# Patient Record
Sex: Female | Born: 1960 | Race: Black or African American | Hispanic: No | Marital: Single | Smoking: Current every day smoker
Health system: Southern US, Community
[De-identification: ages and names within clinical notes are randomized; demographics above are authoritative.]

## PROBLEM LIST (undated history)

## (undated) HISTORY — PX: GASTRIC BYPASS: SHX52

## (undated) HISTORY — PX: KNEE SURGERY: SHX244

## (undated) HISTORY — PX: TUBAL LIGATION: SHX77

---

## 2017-11-01 ENCOUNTER — Encounter (HOSPITAL_COMMUNITY): Payer: Self-pay | Admitting: Emergency Medicine

## 2017-11-01 ENCOUNTER — Ambulatory Visit (HOSPITAL_COMMUNITY)
Admission: EM | Admit: 2017-11-01 | Discharge: 2017-11-01 | Disposition: A | Discharge status: Home / Self Care | Payer: Self-pay | Attending: Family Medicine | Admitting: Family Medicine

## 2017-11-01 DIAGNOSIS — E860 Dehydration: Secondary | ICD-10-CM | POA: Insufficient documentation

## 2017-11-01 DIAGNOSIS — E785 Hyperlipidemia, unspecified: Secondary | ICD-10-CM | POA: Insufficient documentation

## 2017-11-01 DIAGNOSIS — F17210 Nicotine dependence, cigarettes, uncomplicated: Secondary | ICD-10-CM | POA: Insufficient documentation

## 2017-11-01 DIAGNOSIS — R102 Pelvic and perineal pain: Secondary | ICD-10-CM

## 2017-11-01 DIAGNOSIS — I10 Essential (primary) hypertension: Secondary | ICD-10-CM | POA: Insufficient documentation

## 2017-11-01 LAB — POCT URINALYSIS DIP (DEVICE)
Bilirubin Urine: NEGATIVE
Glucose, UA: NEGATIVE mg/dL
Hgb urine dipstick: NEGATIVE
Ketones, ur: NEGATIVE mg/dL
NITRITE: NEGATIVE
Protein, ur: NEGATIVE mg/dL
Specific Gravity, Urine: 1.025 (ref 1.005–1.030)
Urobilinogen, UA: 0.2 mg/dL (ref 0.0–1.0)
pH: 6 (ref 5.0–8.0)

## 2017-11-01 MED ORDER — KETOROLAC TROMETHAMINE 60 MG/2ML IM SOLN
INTRAMUSCULAR | Status: AC
Start: 2017-11-01 — End: ?
  Filled 2017-11-01: qty 2

## 2017-11-01 MED ORDER — KETOROLAC TROMETHAMINE 60 MG/2ML IM SOLN
60.0000 mg | Freq: Once | INTRAMUSCULAR | Status: AC
  Administered 2017-11-01: 60 mg via INTRAMUSCULAR

## 2017-11-01 NOTE — ED Provider Notes (Signed)
  MRN: [id number] DOB: [birth year]  Subjective:   Megan Horne is a 57 y.o. female presenting for acute onset today of intermittent pelvic cramping. Feels like a pressure type sensation that comes in waves. Has also had urinary frequency. Has tried heating pad. Has not tried any medications for relief. Takes vitamins. Denies fever, n/v, dysuria, hematuria, constipation, bloody stools. Patient does not have a PCP, [place] Last pap smear was ~2 years ago, has had normal pap smears. Smokes 5 cigarettes per day. She is trying to quit.   No Known Allergies  Has a history of HTN, HL, meniscus tear of left knee, arthritis.   Past Surgical History:  Procedure Laterality Date  . GASTRIC BYPASS    . KNEE SURGERY    . TUBAL LIGATION      Objective:   Vitals: BP 125/71   Pulse (!) 55   Temp 98.1 F (36.7 C)   Resp 16   SpO2 100%   Physical Exam  Constitutional: She is oriented to person, place, and time. She appears well-developed and well-nourished.  HENT:  Mouth/Throat: Oropharynx is clear and moist.  Eyes: No scleral icterus.  Cardiovascular: Normal rate, regular rhythm and intact distal pulses. Exam reveals no gallop and no friction rub.  No murmur heard. Pulmonary/Chest: No respiratory distress. She has no wheezes. She has no rales.  Abdominal: Soft. Bowel sounds are normal. She exhibits no distension and no mass. There is no tenderness. There is no rebound, no guarding and no CVA tenderness.  Musculoskeletal: She exhibits no edema.  Neurological: She is alert and oriented to person, place, and time.  Skin: Skin is warm and dry. No rash noted. No erythema. No pallor.  Psychiatric: She has a normal mood and affect.   Results for orders placed or performed during the hospital encounter of 11/01/17 (from the past 24 hour(s))  POCT urinalysis dip (device)     Status: Abnormal   Collection Time: 11/01/17 11:39 AM  Result Value Ref Range   Glucose, UA NEGATIVE  NEGATIVE mg/dL   Bilirubin Urine NEGATIVE NEGATIVE   Ketones, ur NEGATIVE NEGATIVE mg/dL   Specific Gravity, Urine 1.025 1.005 - 1.030   Hgb urine dipstick NEGATIVE NEGATIVE   pH 6.0 5.0 - 8.0   Protein, ur NEGATIVE NEGATIVE mg/dL   Urobilinogen, UA 0.2 0.0 - 1.0 mg/dL   Nitrite NEGATIVE NEGATIVE   Leukocytes, UA SMALL (A) NEGATIVE    Assessment and Plan :   Pelvic cramping  Dehydration  Recommended patient hydrate much more, states that she just drinks coffee. IM Toradol in clinic today. Return-to-clinic precautions discussed, patient verbalized understanding.    Wallis BambergMani, Jalani Rominger, PA-C 11/01/17 1147

## 2017-11-01 NOTE — Discharge Instructions (Addendum)
Hydrate well with at least 2 liters (1 gallon) of water daily.  °

## 2017-11-01 NOTE — ED Triage Notes (Signed)
Pt c/o abdominal cramping, states it feels like period cramps but hasn't had a cycle in 5 years. Noticed it this morning. Denies n/v/d. No issues urinating. No vaginal discharge.

## 2017-11-02 LAB — URINE CULTURE

## 2017-12-13 ENCOUNTER — Emergency Department (HOSPITAL_COMMUNITY): Payer: Self-pay

## 2017-12-13 ENCOUNTER — Encounter (HOSPITAL_COMMUNITY): Payer: Self-pay | Admitting: *Deleted

## 2017-12-13 ENCOUNTER — Emergency Department (HOSPITAL_COMMUNITY)
Admission: EM | Admit: 2017-12-13 | Discharge: 2017-12-13 | Disposition: A | Discharge status: Home / Self Care | Payer: Self-pay | Attending: Emergency Medicine | Admitting: Emergency Medicine

## 2017-12-13 ENCOUNTER — Other Ambulatory Visit: Payer: Self-pay

## 2017-12-13 DIAGNOSIS — K5790 Diverticulosis of intestine, part unspecified, without perforation or abscess without bleeding: Secondary | ICD-10-CM | POA: Insufficient documentation

## 2017-12-13 DIAGNOSIS — R1030 Lower abdominal pain, unspecified: Secondary | ICD-10-CM

## 2017-12-13 DIAGNOSIS — Z79899 Other long term (current) drug therapy: Secondary | ICD-10-CM | POA: Insufficient documentation

## 2017-12-13 DIAGNOSIS — F17210 Nicotine dependence, cigarettes, uncomplicated: Secondary | ICD-10-CM | POA: Insufficient documentation

## 2017-12-13 LAB — COMPREHENSIVE METABOLIC PANEL
ALT: 16 U/L (ref 0–44)
AST: 22 U/L (ref 15–41)
Albumin: 4.1 g/dL (ref 3.5–5.0)
Alkaline Phosphatase: 61 U/L (ref 38–126)
Anion gap: 10 (ref 5–15)
BILIRUBIN TOTAL: 0.9 mg/dL (ref 0.3–1.2)
BUN: 9 mg/dL (ref 6–20)
CALCIUM: 8.9 mg/dL (ref 8.9–10.3)
CO2: 25 mmol/L (ref 22–32)
CREATININE: 0.83 mg/dL (ref 0.44–1.00)
Chloride: 107 mmol/L (ref 98–111)
GFR calc non Af Amer: >60 mL/min (ref >60)
GLUCOSE: 95 mg/dL (ref 70–99)
Potassium: 3.7 mmol/L (ref 3.5–5.1)
Sodium: 142 mmol/L (ref 135–145)
TOTAL PROTEIN: 7.2 g/dL (ref 6.5–8.1)

## 2017-12-13 LAB — URINALYSIS, ROUTINE W REFLEX MICROSCOPIC
BILIRUBIN URINE: NEGATIVE
Glucose, UA: NEGATIVE mg/dL
HGB URINE DIPSTICK: NEGATIVE
KETONES UR: 5 mg/dL — AB
Leukocytes, UA: NEGATIVE
Nitrite: NEGATIVE
PROTEIN: NEGATIVE mg/dL
Specific Gravity, Urine: 1.028 (ref 1.005–1.030)
pH: 5 (ref 5.0–8.0)

## 2017-12-13 LAB — CBC
HCT: 39.4 % (ref 36.0–46.0)
Hemoglobin: 12.5 g/dL (ref 12.0–15.0)
MCH: 29.3 pg (ref 26.0–34.0)
MCHC: 31.7 g/dL (ref 30.0–36.0)
MCV: 92.3 fL (ref 78.0–100.0)
PLATELETS: 233 10*3/uL (ref 150–400)
RBC: 4.27 MIL/uL (ref 3.87–5.11)
RDW: 13.2 % (ref 11.5–15.5)
WBC: 4.5 10*3/uL (ref 4.0–10.5)

## 2017-12-13 LAB — LIPASE, BLOOD: Lipase: 30 U/L (ref 11–51)

## 2017-12-13 LAB — I-STAT BETA HCG BLOOD, ED (MC, WL, AP ONLY)

## 2017-12-13 MED ORDER — KETOROLAC TROMETHAMINE 30 MG/ML IJ SOLN
30.0000 mg | Freq: Once | INTRAMUSCULAR | Status: AC
  Administered 2017-12-13: 30 mg via INTRAVENOUS
  Filled 2017-12-13: qty 1

## 2017-12-13 MED ORDER — IOPAMIDOL (ISOVUE-370) INJECTION 76%
100.0000 mL | Freq: Once | INTRAVENOUS | Status: AC | PRN
  Administered 2017-12-13: 100 mL via INTRAVENOUS

## 2017-12-13 MED ORDER — SODIUM CHLORIDE 0.9 % IV BOLUS
1000.0000 mL | Freq: Once | INTRAVENOUS | Status: AC
  Administered 2017-12-13: 1000 mL via INTRAVENOUS

## 2017-12-13 NOTE — ED Notes (Signed)
Pt given urine cup to provide urine sample out in the waiting room. 

## 2017-12-13 NOTE — ED Provider Notes (Signed)
[facility] EMERGENCY DEPARTMENT Provider Note   CSN: [id number] Arrival date & time: 12/13/17  1002     History   Chief Complaint Chief Complaint  Patient presents with  . Abdominal Pain    HPI Megan Horne is a 57 y.o. otherwise healthy female, with a PSHx of gastric bypass and tubal ligation, who presents to the ED with complaints of lower abdominal pain that began today.  She states that she has had similar pain several weeks ago, was seen at urgent care on 11/01/2017 and her UA was unremarkable, she was given a 60 mg IM Toradol shot and discharged home.  She states that she was fine after that however this morning her pain returned and feels the same as when she was seen at the urgent care several weeks ago.  She describes the pain as 6/10 constant crampy/contraction-like lower abdominal pain which is nonradiating with no known aggravating factors, and no treatments tried prior to arrival.  She endorses drinking alcohol occasionally, has about 1-2 wine coolers approximately 4 times per week.  She states that she is very active, walking and running a lot.  She does not currently have a PCP since [place] back [place].  She denies fevers, chills, CP, SOB, nausea/vomiting, diarrhea/constipation, obstipation, melena, hematochezia, hematuria, dysuria, vaginal bleeding/discharge, myalgias, arthralgias, numbness, tingling, focal weakness, or any other complaints at this time. Denies recent travel, sick contacts, suspicious food intake, or frequent NSAID use.   The history is provided by the patient and medical records. No language interpreter was used.    History reviewed. No pertinent past medical history.  There are no active problems to display for this patient.   Past Surgical History:  Procedure Laterality Date  . GASTRIC BYPASS    . KNEE SURGERY    . TUBAL LIGATION       OB History   None      Home Medications    Prior to  Admission medications   Medication Sig Start Date End Date Taking? Authorizing Provider  Multiple Vitamin (MULTIVITAMIN WITH MINERALS) TABS tablet Take 1 tablet by mouth daily.   Yes [provider]  vitamin B-12 (CYANOCOBALAMIN) 1000 MCG tablet Take 1,000 mcg by mouth 3 (three) times a week.   Yes [provider]    Family History No family history on file.  Social History Social History   Tobacco Use  . Smoking status: Current Every Day Smoker    Packs/day: 0.25    Types: Cigarettes  . Smokeless tobacco: Never Used  Substance Use Topics  . Alcohol use: Never    Frequency: Never  . Drug use: Not on file     Allergies   Amoxicillin   Review of Systems Review of Systems  Constitutional: Negative for chills and fever.  Respiratory: Negative for shortness of breath.   Cardiovascular: Negative for chest pain.  Gastrointestinal: Positive for abdominal pain. Negative for blood in stool, constipation, diarrhea, nausea and vomiting.  Genitourinary: Negative for dysuria, hematuria, vaginal bleeding and vaginal discharge.  Musculoskeletal: Negative for arthralgias and myalgias.  Skin: Negative for color change.  Allergic/Immunologic: Negative for immunocompromised state.  Neurological: Negative for weakness and numbness.  Psychiatric/Behavioral: Negative for confusion.   All other systems reviewed and are negative for acute change except as noted in the HPI.    Physical Exam Updated Vital Signs BP 119/61 (BP Location: Right Arm)   Pulse (!) 52   Temp 98.4 F (  36.9 C) (Oral)   Resp 16   SpO2 100%   Physical Exam  Constitutional: She is oriented to person, place, and time. Vital signs are normal. She appears well-developed and well-nourished.  Non-toxic appearance. No distress.  Afebrile, nontoxic, NAD  HENT:  Head: Normocephalic and atraumatic.  Mouth/Throat: Oropharynx is clear and moist and mucous membranes are normal.  Eyes: Conjunctivae and EOM are  normal. Right eye exhibits no discharge. Left eye exhibits no discharge.  Neck: Normal range of motion. Neck supple.  Cardiovascular: Regular rhythm, normal heart sounds and intact distal pulses. Bradycardia present. Exam reveals no gallop and no friction rub.  No murmur heard. Mildly bradycardic similar to prior visit  Pulmonary/Chest: Effort normal and breath sounds normal. No respiratory distress. She has no decreased breath sounds. She has no wheezes. She has no rhonchi. She has no rales.  Abdominal: Soft. Normal appearance and bowel sounds are normal. She exhibits no distension. There is tenderness in the right lower quadrant, suprapubic area and left lower quadrant. There is no rigidity, no rebound, no guarding, no CVA tenderness, no tenderness at McBurney's point and negative Murphy's sign.  Soft, nondistended, +BS throughout, with moderate lower abd TTP across entire lower abd, no r/g/r, neg murphy's, neg mcburney's, no CVA TTP   Musculoskeletal: Normal range of motion.  Neurological: She is alert and oriented to person, place, and time. She has normal strength. No sensory deficit.  Skin: Skin is warm, dry and intact. No rash noted.  Psychiatric: She has a normal mood and affect.  Nursing note and vitals reviewed.    ED Treatments / Results  Labs (all labs ordered are listed, but only abnormal results are displayed) Labs Reviewed  URINALYSIS, ROUTINE W REFLEX MICROSCOPIC - Abnormal; Notable for the following components:      Result Value   Color, Urine AMBER (*)    Ketones, ur 5 (*)    All other components within normal limits  LIPASE, BLOOD  COMPREHENSIVE METABOLIC PANEL  CBC  I-STAT BETA HCG BLOOD, ED ([facility], [facility], [facility] ONLY)    EKG None  Radiology Ct Abdomen Pelvis W Contrast  Result Date: 12/13/2017 CLINICAL DATA:  Lower abdominal pain. EXAM: CT ABDOMEN AND PELVIS WITH CONTRAST TECHNIQUE: Multidetector CT imaging of the abdomen and pelvis was performed using the standard  protocol following bolus administration of intravenous contrast. CONTRAST:  ISOVUE-370 IOPAMIDOL (ISOVUE-370) INJECTION 76% COMPARISON:  None. FINDINGS: Lower chest: Normal. Hepatobiliary: 9 mm cyst in the left lobe of the liver. Liver parenchyma is otherwise normal. Biliary tree is normal. Pancreas: Unremarkable. No pancreatic ductal dilatation or surrounding inflammatory changes. Spleen: Normal in size without focal abnormality. Adrenals/Urinary Tract: Adrenal glands are normal. 7 mm cyst in the upper pole of the right kidney. 3 mm cyst in the mid right kidney. Left pelvic kidney. Bladder appears normal. Stomach/Bowel: Surgical staples from previous gastric bypass. There are a few diverticula in the distal colon. Terminal ileum and appendix are normal. Vascular/Lymphatic: Minimal aortic atherosclerosis.  No adenopathy. Reproductive: Small benign-appearing calcifications in the uterus. Ovaries appear normal. Other: No abdominal wall hernia or abnormality. No abdominopelvic ascites. Musculoskeletal: No acute abnormalities. Fairly severe facet arthritis at L4-5 and L5-S1. IMPRESSION: No acute abnormalities of the abdomen or pelvis. Slight diverticulosis of the distal colon. Left pelvic kidney. Electronically Signed   By: Francene Boyers M.D.   On: 12/13/2017 14:25    Procedures Procedures (including critical care time)  Medications Ordered in ED Medications  sodium chloride 0.9 %  bolus 1,000 mL (0 mLs Intravenous Stopped 12/13/17 1420)  ketorolac (TORADOL) 30 MG/ML injection 30 mg (30 mg Intravenous Given 12/13/17 1304)  iopamidol (ISOVUE-370) 76 % injection 100 mL (100 mLs Intravenous Contrast Given 12/13/17 1402)     Initial Impression / Assessment and Plan / ED Course  I have reviewed the triage vital signs and the nursing notes.  Pertinent labs & imaging results that were available during my care of the patient were reviewed by me and considered in my medical decision making (see chart for  details).     57 y.o. female here with lower abd pain since this morning, similar symptoms a few weeks ago. On exam, moderate lower abd TTP, nonperitoneal. Work up thus far reveals: betaHCG neg, lipase WNL, CMP/CBC WNL. U/A pending. Will proceed with CT imaging to r/o emergent causes and further evaluate this recurrent pain. Will give toradol and fluids and reassess shortly.   3:40 PM U/A unremarkable without evidence of UTI. CT showing 9mm cyst in L lobe of liver, 7mm cyst in upper pole of R kidney and 3mm mid right kidney cyst, with incidental finding of L pelvic kidney; also with mild diverticulosis but no diverticulitis. Otherwise no acute findings. Unclear exact etiology of her pain, however doesn't appear to have any acute findings on work up today. Advised increased fiber/water intake in diet, tylenol for pain (since pt can't take NSAIDs due to gastric bypass surgery), and f/up with [facility] in 1wk for recheck and to establish medical care. I explained the diagnosis and have given explicit precautions to return to the ER including for any other new or worsening symptoms. The patient understands and accepts the medical plan as it's been dictated and I have answered their questions. Discharge instructions concerning home care and prescriptions have been given. The patient is STABLE and is discharged to home in good condition.    Final Clinical Impressions(s) / ED Diagnoses   Final diagnoses:  Lower abdominal pain  Diverticulosis    ED Discharge Orders    [address]None       Cayleigh Paull, [place], [place]-C 12/13/17 1540    Terrilee FilesButler, Michael C, MD 12/14/17 [phone]

## 2017-12-13 NOTE — ED Notes (Signed)
Pt states that she has R hip pain for years, has recently gotten worse

## 2017-12-13 NOTE — ED Notes (Signed)
Pt returned from ct

## 2017-12-13 NOTE — Discharge Instructions (Addendum)
Your work up today has been very reassuring, your CT scan showed some diverticulosis which is unlikely the cause of your pain but you will need to increase your fiber and water intake in your diet. Use tylenol as directed as needed for pain. Stay well hydrated. If you get constipated, then use over the counter miralax as needed. Follow up with the [facility] in 1 week for recheck of symptoms and ongoing management of your recurrent abdominal pain. Return to the ER for emergent changes or worsening symptoms.   Abdominal (belly) pain can be caused by many things. Your caregiver performed an examination and possibly ordered blood/urine tests and imaging (CT scan, x-rays, ultrasound). Many cases can be observed and treated at home after initial evaluation in the emergency department. Even though you are being discharged home, abdominal pain can be unpredictable. Therefore, you need a repeated exam if your pain does not resolve, returns, or worsens. Most patients with abdominal pain don't have to be admitted to the hospital or have surgery, but serious problems like appendicitis and gallbladder attacks can start out as nonspecific pain. Many abdominal conditions cannot be diagnosed in one visit, so follow-up evaluations are very important. SEEK IMMEDIATE MEDICAL ATTENTION IF YOU DEVELOP ANY OF THE FOLLOWING SYMPTOMS: The pain does not go away or becomes severe.  A temperature above 101 develops.  Repeated vomiting occurs (multiple episodes).  The pain becomes localized to portions of the abdomen. The right side could possibly be appendicitis. In an adult, the left lower portion of the abdomen could be colitis or diverticulitis.  Blood is being passed in stools or vomit (bright red or black tarry stools).  Return also if you develop chest pain, difficulty breathing, dizziness or fainting, or become confused, poorly responsive, or inconsolable (young children). The constipation stays for more  than 4 days.  There is belly (abdominal) or rectal pain.  You do not seem to be getting better.

## 2017-12-13 NOTE — ED Triage Notes (Signed)
Pt c/o bil  Lower abd pain with hx of the same, pt denies n/v/d, pt A&O x4

## 2018-06-16 ENCOUNTER — Emergency Department (HOSPITAL_COMMUNITY)
Admission: EM | Admit: 2018-06-16 | Discharge: 2018-06-16 | Disposition: A | Discharge status: Home / Self Care | Payer: Self-pay | Attending: Emergency Medicine | Admitting: Emergency Medicine

## 2018-06-16 ENCOUNTER — Emergency Department (HOSPITAL_COMMUNITY): Payer: Self-pay

## 2018-06-16 DIAGNOSIS — Y92241 Library as the place of occurrence of the external cause: Secondary | ICD-10-CM | POA: Insufficient documentation

## 2018-06-16 DIAGNOSIS — Y999 Unspecified external cause status: Secondary | ICD-10-CM | POA: Insufficient documentation

## 2018-06-16 DIAGNOSIS — Y939 Activity, unspecified: Secondary | ICD-10-CM | POA: Insufficient documentation

## 2018-06-16 DIAGNOSIS — W230XXA Caught, crushed, jammed, or pinched between moving objects, initial encounter: Secondary | ICD-10-CM | POA: Insufficient documentation

## 2018-06-16 DIAGNOSIS — F17210 Nicotine dependence, cigarettes, uncomplicated: Secondary | ICD-10-CM | POA: Insufficient documentation

## 2018-06-16 DIAGNOSIS — Z79899 Other long term (current) drug therapy: Secondary | ICD-10-CM | POA: Insufficient documentation

## 2018-06-16 DIAGNOSIS — S92424B Nondisplaced fracture of distal phalanx of right great toe, initial encounter for open fracture: Secondary | ICD-10-CM

## 2018-06-16 DIAGNOSIS — S92424A Nondisplaced fracture of distal phalanx of right great toe, initial encounter for closed fracture: Secondary | ICD-10-CM | POA: Insufficient documentation

## 2018-06-16 MED ORDER — HYDROCODONE-ACETAMINOPHEN 5-325 MG PO TABS
1.0000 | ORAL_TABLET | Freq: Once | ORAL | Status: AC
Start: 2018-06-16 — End: 2018-06-16
  Administered 2018-06-16: 1 via ORAL
  Filled 2018-06-16: qty 1

## 2018-06-16 MED ORDER — CEPHALEXIN 250 MG PO CAPS: 250.0000 mg | ORAL_CAPSULE | Freq: Four times a day (QID) | ORAL | 0 refills | Status: AC

## 2018-06-16 MED ORDER — HYDROCODONE-ACETAMINOPHEN 5-325 MG PO TABS: 1.0000 | ORAL_TABLET | Freq: Four times a day (QID) | ORAL | 0 refills | Status: AC | PRN

## 2018-06-16 NOTE — ED Provider Notes (Signed)
[facility] EMERGENCY DEPARTMENT Provider Note   CSN: [id number] Arrival date & time: 06/16/18  1237     History   Chief Complaint Chief Complaint  Patient presents with  . Foot Injury    HPI Megan Horne is a 58 y.o. female presenting for evaluation of foot injury.  Patient states just prior to arrival she was at [employer] when a wooden table fell on her toe.  She reports acute onset right great toe pain and second toe pain.  She reports she is ambulatory with a limp.  She denies numbness or tingling.  She is not on blood thinners.  She has not taken anything for pain including Tylenol or ibuprofen.  Patient states she takes vitamins daily due to her gastric bypass surgery 7 years ago, no other medical problems or medications.  Pain is constant, worse with palpation.  Nothing makes it better.  She has a superficial laceration of her right great toe without active bleeding. She denies injury elsewhere.  HPI  No past medical history on file.  There are no active problems to display for this patient.   Past Surgical History:  Procedure Laterality Date  . GASTRIC BYPASS    . KNEE SURGERY    . TUBAL LIGATION       OB History   No obstetric history on file.      Home Medications    Prior to Admission medications   Medication Sig Start Date End Date Taking? Authorizing Provider  cephALEXin (KEFLEX) 250 MG capsule Take 1 capsule (250 mg total) by mouth 4 (four) times daily for 5 days. 06/16/18 06/21/18  Elizabelle Fite, PA-C  HYDROcodone-acetaminophen (NORCO/VICODIN) 5-325 MG tablet Take 1 tablet by mouth every 6 (six) hours as needed for severe pain. 06/16/18   Jentri Aye, PA-C  Multiple Vitamin (MULTIVITAMIN WITH MINERALS) TABS tablet Take 1 tablet by mouth daily.    [provider]  vitamin B-12 (CYANOCOBALAMIN) 1000 MCG tablet Take 1,000 mcg by mouth 3 (three) times a week.    [provider]    Family History No family  history on file.  Social History Social History   Tobacco Use  . Smoking status: Current Every Day Smoker    Packs/day: 0.25    Types: Cigarettes  . Smokeless tobacco: Never Used  Substance Use Topics  . Alcohol use: Never    Frequency: Never  . Drug use: Not on file     Allergies   Amoxicillin   Review of Systems Review of Systems  Musculoskeletal: Positive for arthralgias.  Hematological: Does not bruise/bleed easily.     Physical Exam Updated Vital Signs BP (!) 158/91 (BP Location: Left Arm)   Pulse (!) 52   Temp 98.2 F (36.8 C) (Oral)   Resp 16   Ht 5\' 5"  (1.651 m)   Wt 75.8 kg   SpO2 99%   BMI 27.79 kg/m   Physical Exam Vitals signs and nursing note reviewed.  Constitutional:      General: She is not in acute distress.    Appearance: She is well-developed.  HENT:     Head: Normocephalic and atraumatic.  Neck:     Musculoskeletal: Normal range of motion.  Pulmonary:     Effort: Pulmonary effort is normal.  Abdominal:     General: There is no distension.  Musculoskeletal:        General: Swelling and tenderness present.     Comments: Swelling of the right great toe  and second toe.  Tenderness palpation of the great toe and second toe.  No tenderness palpation of the foot or ankle.  Pedal pulses intact.  Good cap refill.  Sensation of distal toe intact. Superficial laceration just proximal to the cuticle without active bleeding. No associated subungal hematoma.   Skin:    General: Skin is warm.     Capillary Refill: Capillary refill takes less than 2 seconds.     Findings: No rash.  Neurological:     Mental Status: She is alert and oriented to person, place, and time.      ED Treatments / Results  Labs (all labs ordered are listed, but only abnormal results are displayed) Labs Reviewed - No data to display  EKG None  Radiology Dg Foot Complete Right  Result Date: 06/16/2018 CLINICAL DATA:  58 year old female with right great toe injury.  A wooden table fell on her toe. EXAM: RIGHT FOOT COMPLETE - 3+ VIEW COMPARISON:  None. FINDINGS: Nondisplaced, mildly comminuted fracture through the tuft of the distal phalanx of the great toe. There is associated soft tissue swelling. No additional fracture or malalignment identified. Mild degenerative change at the great toe MTP joint. IMPRESSION: Mildly comminuted but nondisplaced fracture through the distal tuft of the great toe distal phalanx. Electronically Signed   By: Malachy Moan M.D.   On: 06/16/2018 14:49    Procedures Procedures (including critical care time)  Medications Ordered in ED Medications  HYDROcodone-acetaminophen (NORCO/VICODIN) 5-325 MG per tablet 1 tablet (1 tablet Oral Given 06/16/18 1430)     Initial Impression / Assessment and Plan / ED Course  I have reviewed the triage vital signs and the nursing notes.  Pertinent labs & imaging results that were available during my care of the patient were reviewed by me and considered in my medical decision making (see chart for details).     Pt presenting for evaluation of toe pain after a wooden table fell on her toe.  Physical exam shows swelling and tenderness, patient is neurovascularly intact.  Superficial laceration does not require suturing.  Will obtain x-ray for further evaluation.  Norco given for pain control.  X-rays viewed interpreted by me, shows comminuted and nondisplaced fracture of the distal great toe without fracture elsewhere.  As there is overlying laceration, will treat with antibiotics.  Patient given postop shoe for pain control.  PMP checked, patient without recent controlled substance prescription.  Discussed follow-up with podiatry for further evaluation management.  At this time, patient appears safe for discharge.  Return precautions given.  Patient states she understands agrees plan.   Final Clinical Impressions(s) / ED Diagnoses   Final diagnoses:  Open nondisplaced fracture of distal  phalanx of right great toe, initial encounter    ED Discharge Orders         Ordered    HYDROcodone-acetaminophen (NORCO/VICODIN) 5-325 MG tablet  Every 6 hours PRN     06/16/18 1502    cephALEXin (KEFLEX) 250 MG capsule  4 times daily     06/16/18 1502           Jovoni Borkenhagen, PA-C 06/16/18 1540    Jacalyn Lefevre, MD 06/16/18 1629

## 2018-06-16 NOTE — Discharge Instructions (Signed)
Take antibiotics as prescribed. Use Tylenol as needed for mild to moderate pain.  Use Norco as needed for severe breakthrough pain. Have caution, this is a narcotic medicine.  Not drive or operate heavy machinery while taking this medicine. Keep your foot elevated to help with pain and swelling. Use ice to help with pain and swelling. Follow-up with podiatry for further evaluation. Return to the emergency room if you develop fevers, pus draining from the area, or any new, worsening, concerning symptoms.

## 2018-06-16 NOTE — ED Triage Notes (Signed)
Onset PTA wood table fell on left toe, bleeding noted around cuticle.

## 2019-04-15 IMAGING — CT CT ABD-PELV W/ CM
2 of 6 series · 17 of 46 positions shown, 19 images · IV contrast (iopamidol)
Comparison: None.

CLINICAL DATA: Lower abdominal pain.

EXAM:
CT ABDOMEN AND PELVIS WITH CONTRAST
TECHNIQUE: Multidetector CT imaging of the abdomen and pelvis was performed
using the standard protocol following bolus administration of
intravenous contrast.
CONTRAST:  100mL NOLCB8-Y2R IOPAMIDOL (NOLCB8-Y2R) INJECTION 76%

[Series 3: abd/ pelvis 5.0 i30f 2 · axial · 0.78mm/px · z∈[+721,+1101]mm · 14 of 88 slices shown, 16 images]
[im 6/88  soft-tissue]
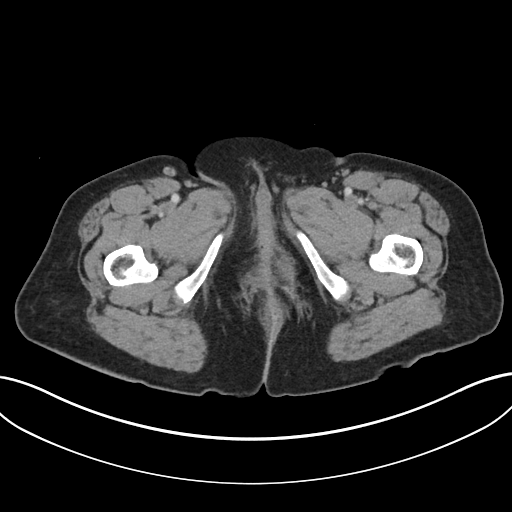
[im 6/88  bone]
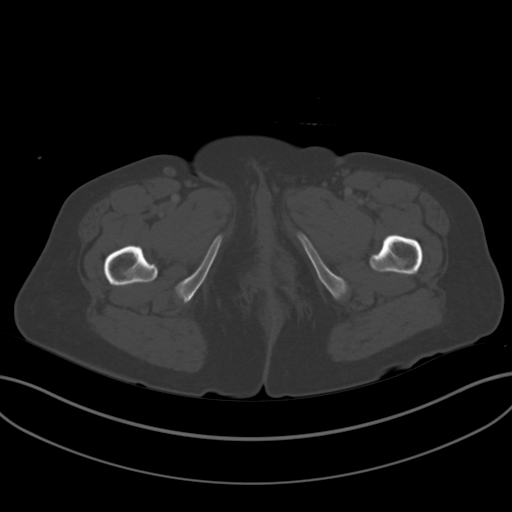
[im 11/88  soft-tissue]
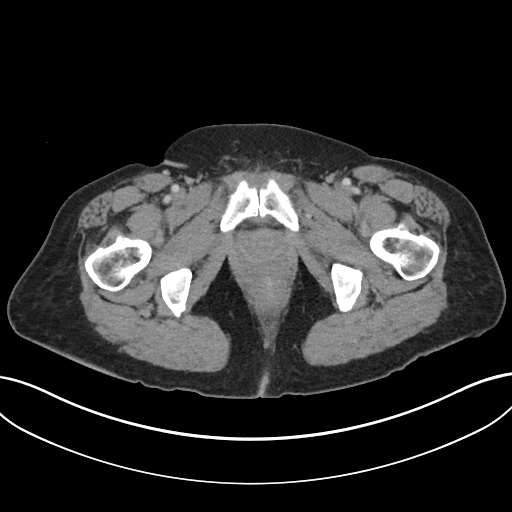
[im 17/88  soft-tissue]
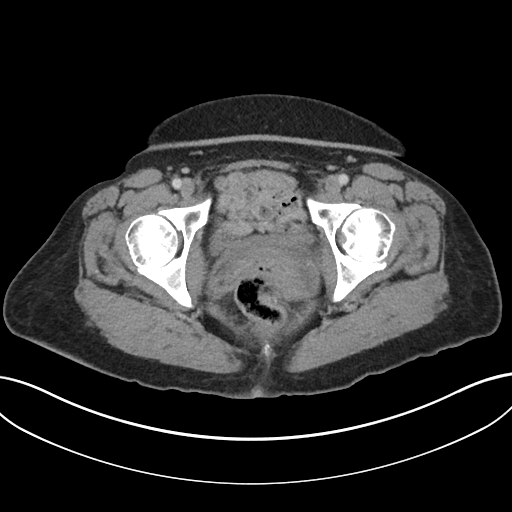
[im 22/88  soft-tissue]
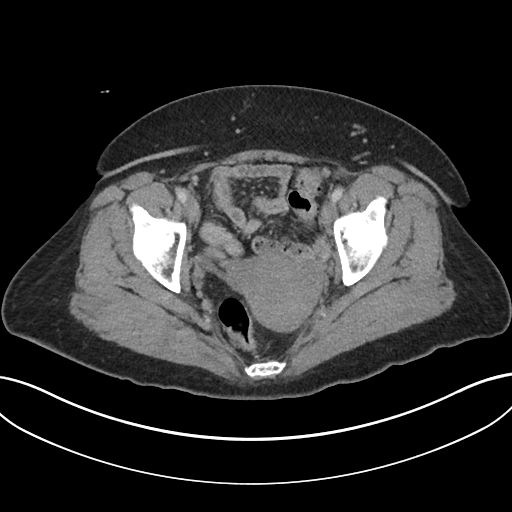
[im 28/88  soft-tissue]
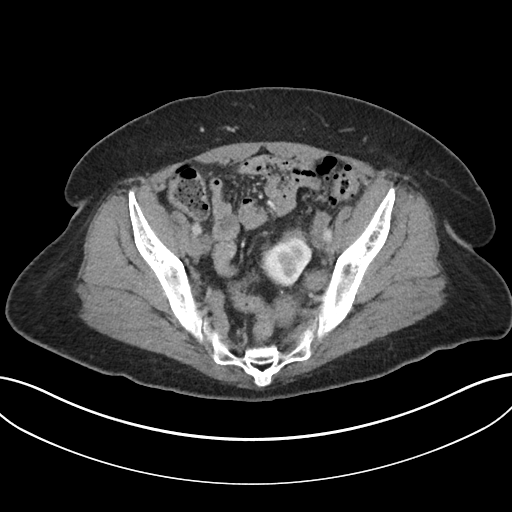
[im 33/88  soft-tissue]
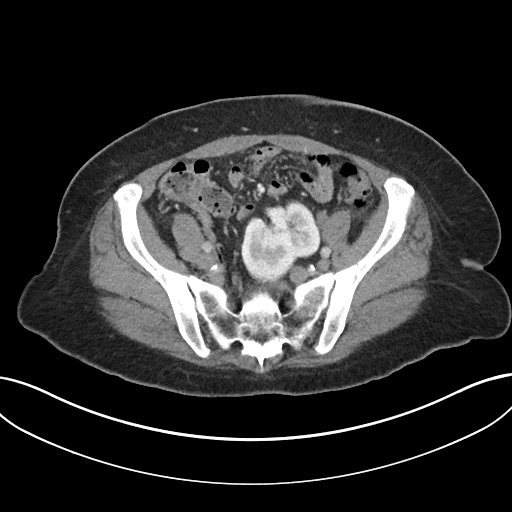
[im 39/88  soft-tissue]
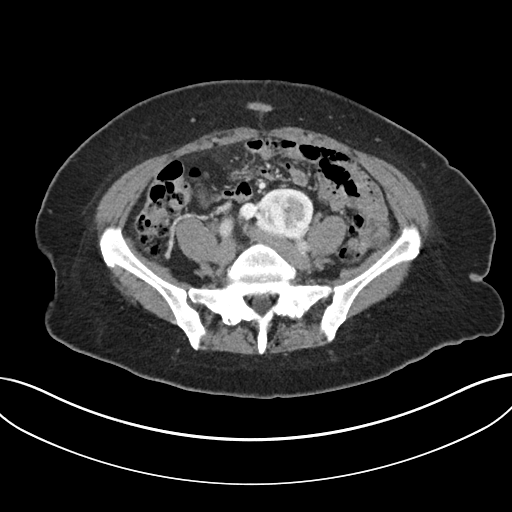
[im 49/88  soft-tissue]
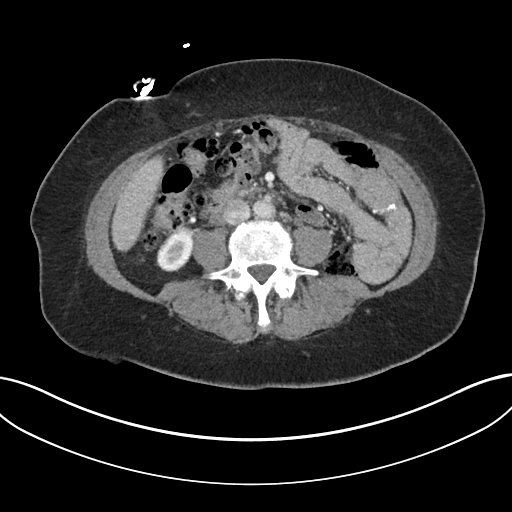
[im 55/88  soft-tissue]
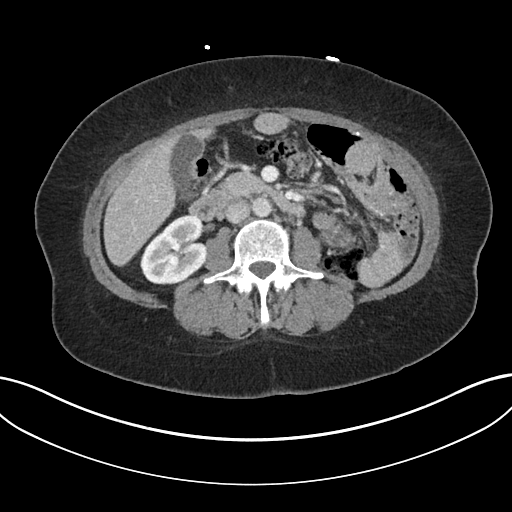
[im 55/88  bone]
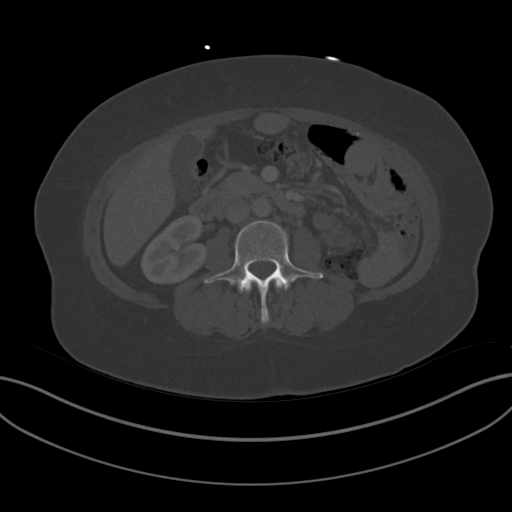
[im 60/88  soft-tissue]
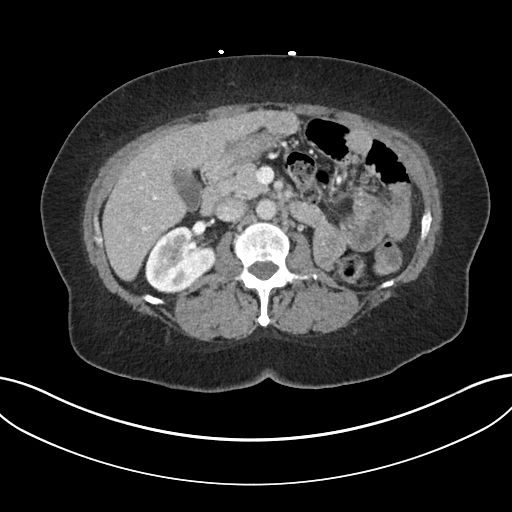
[im 66/88  soft-tissue]
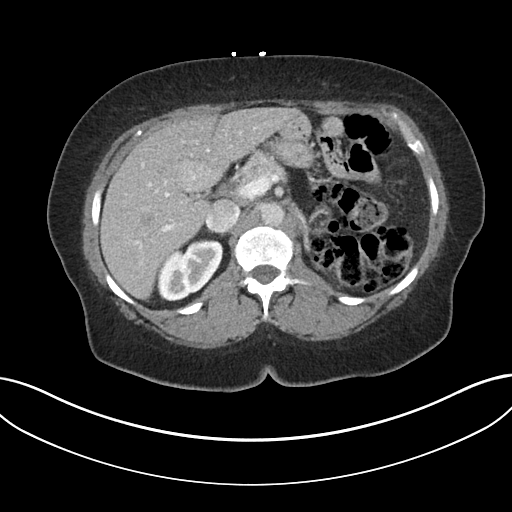
[im 71/88  soft-tissue]
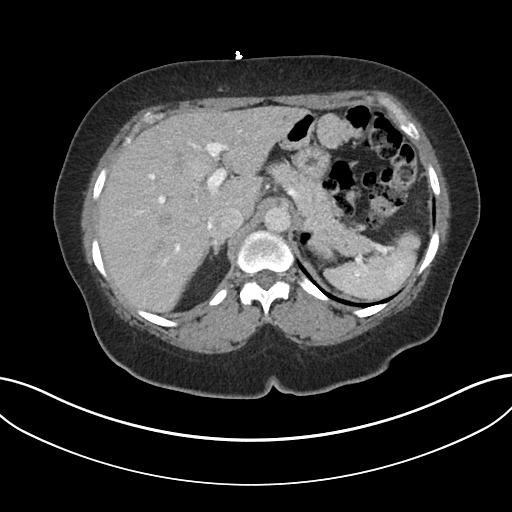
[im 77/88  soft-tissue]
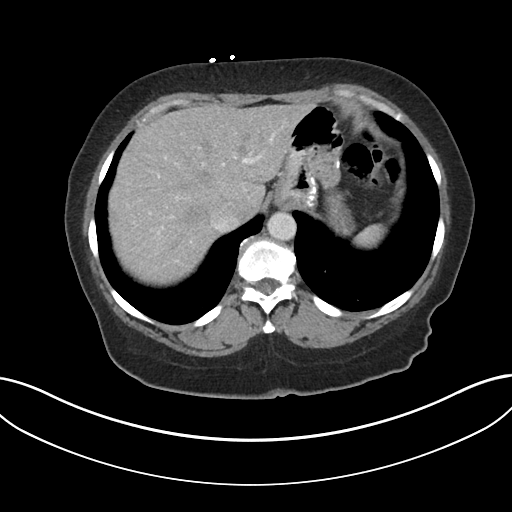
[im 82/88  soft-tissue]
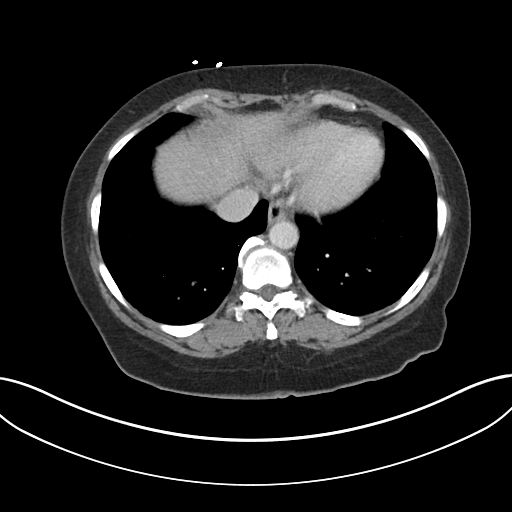

[Series 6: coronal soft tissue · coronal · 0.83mm/px · 3 of 101 slices shown]
[im 34/101  soft-tissue]
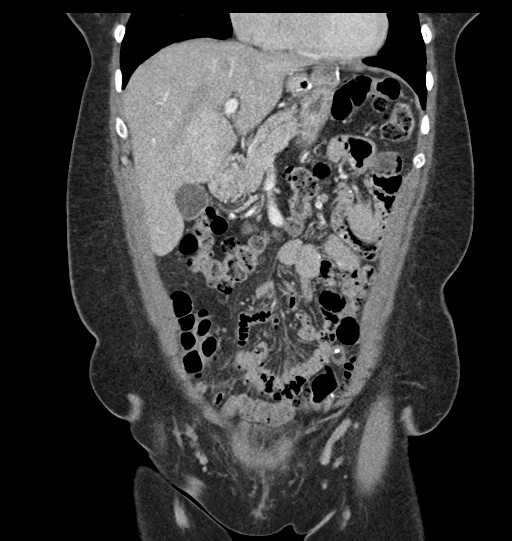
[im 45/101  soft-tissue]
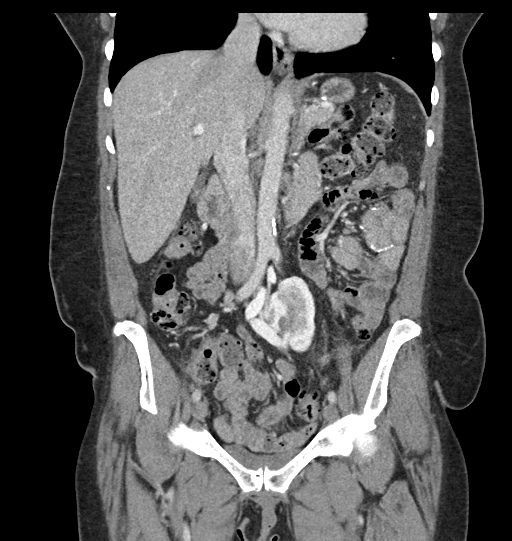
[im 56/101  soft-tissue]
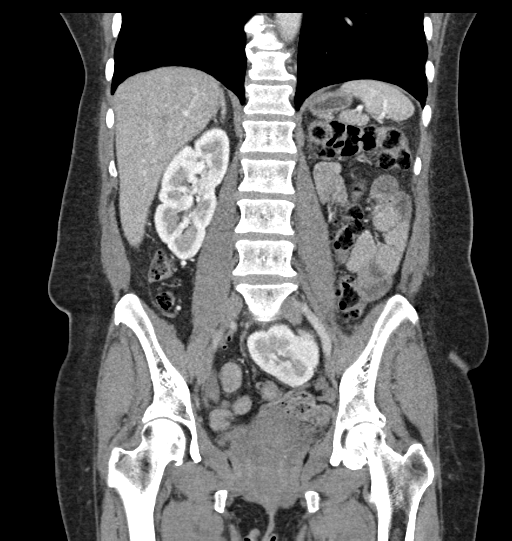

[17 of 46 positions shown; findings below may reference images not displayed]

FINDINGS: Lower chest: Normal.

Hepatobiliary: 9 mm cyst in the left lobe of the liver. Liver
parenchyma is otherwise normal. Biliary tree is normal.

Pancreas: Unremarkable. No pancreatic ductal dilatation or
surrounding inflammatory changes.

Spleen: Normal in size without focal abnormality.

Adrenals/Urinary Tract: Adrenal glands are normal. 7 mm cyst in the
upper pole of the right kidney. 3 mm cyst in the mid right kidney.
Left pelvic kidney. Bladder appears normal.

Stomach/Bowel: Surgical staples from previous gastric bypass. There
are a few diverticula in the distal colon. Terminal ileum and
appendix are normal.

Vascular/Lymphatic: Minimal aortic atherosclerosis.  No adenopathy.

Reproductive: Small benign-appearing calcifications in the uterus.
Ovaries appear normal.

Other: No abdominal wall hernia or abnormality. No abdominopelvic
ascites.

Musculoskeletal: No acute abnormalities. Fairly severe facet
arthritis at L4-5 and L5-S1.
IMPRESSION: No acute abnormalities of the abdomen or pelvis.

Slight diverticulosis of the distal colon.

Left pelvic kidney.

## 2019-10-17 IMAGING — DX DG FOOT COMPLETE 3+V*R*
2 series · 3 of 3 positions shown · non-contrast
Comparison: None.

CLINICAL DATA: 57-year-old female with right great toe injury. [place]OT[place]
Zeinab table fell on her toe.

EXAM:
RIGHT FOOT COMPLETE - 3+ VIEW

[Series 1: foot · 0.14mm/px · 2 of 2 slices shown]
[im 1/2]
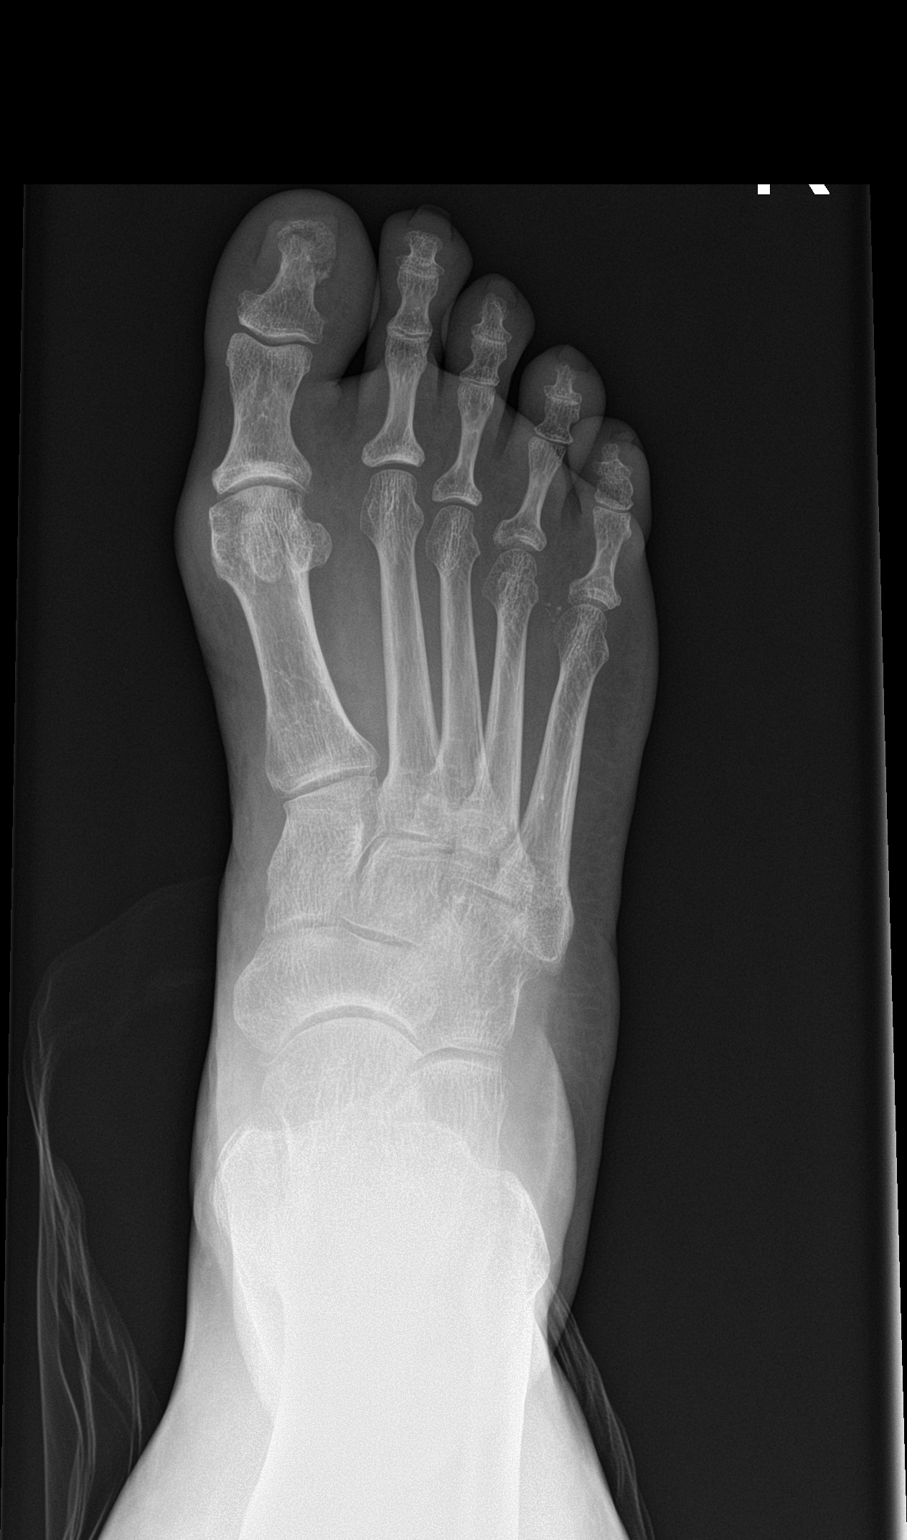
[im 2/2]
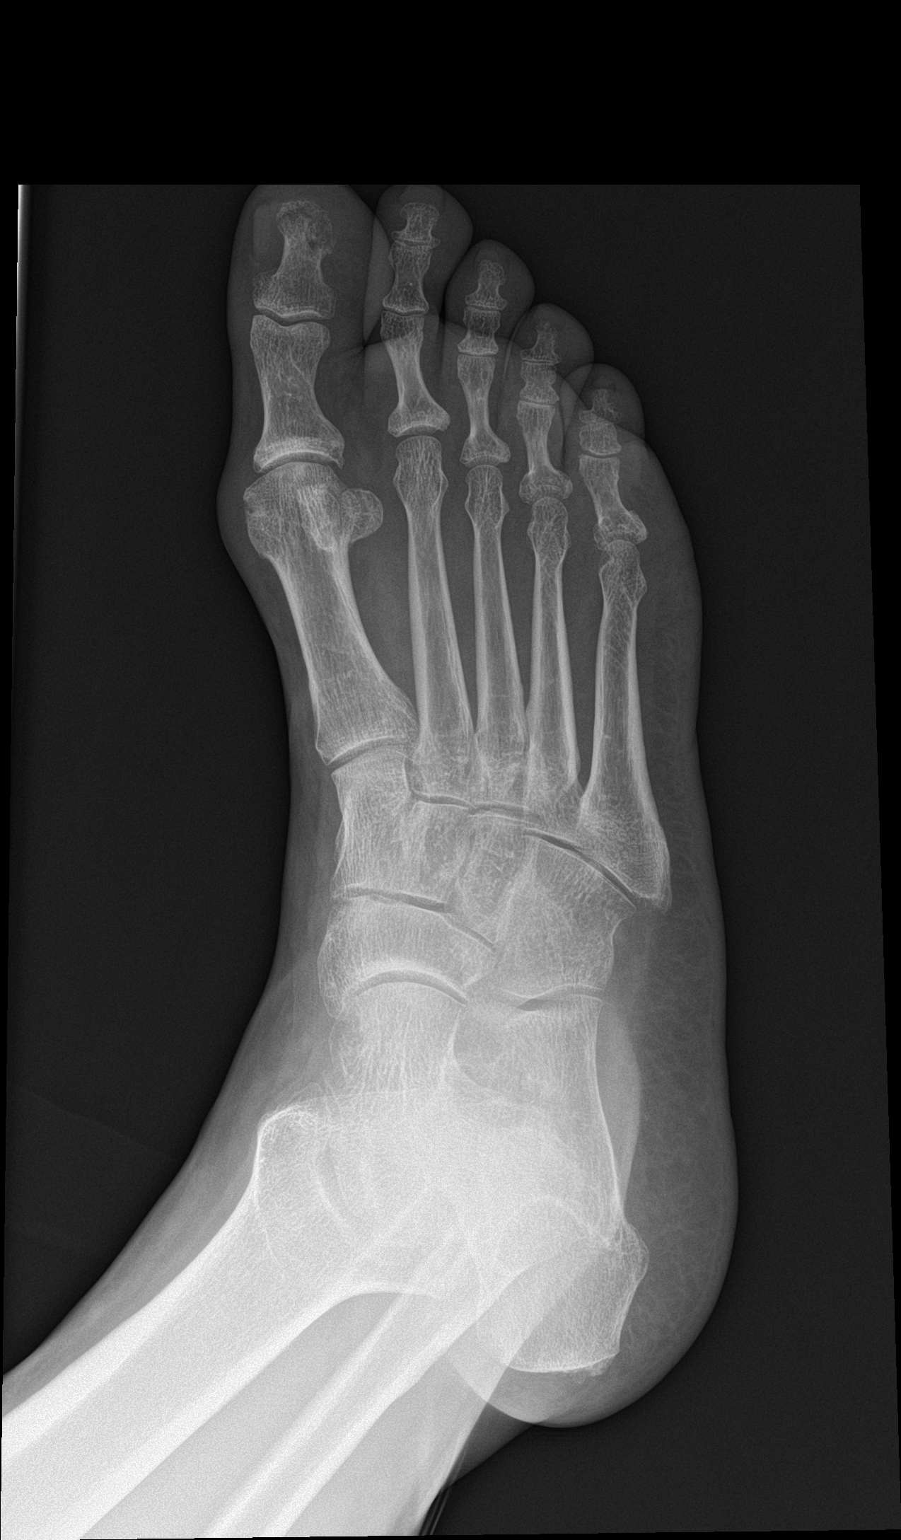

[leg]
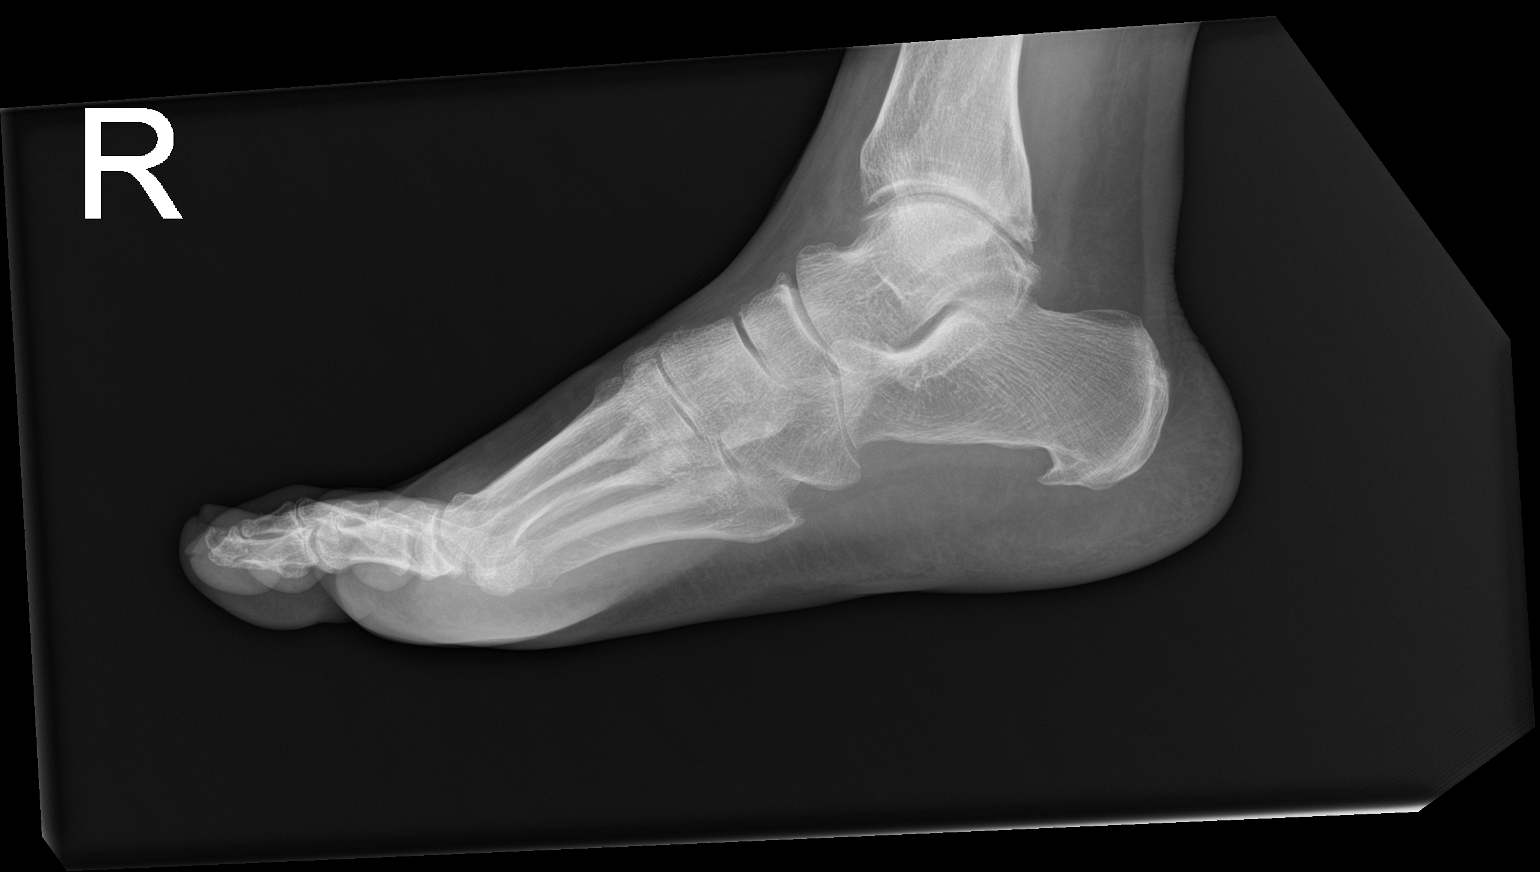

[3 of 3 positions shown; findings below may reference images not displayed]

FINDINGS: Nondisplaced, mildly comminuted fracture through the tuft of the
distal phalanx of the great toe. There is associated soft tissue
swelling. No additional fracture or malalignment identified. Mild
degenerative change at the great toe MTP joint.
IMPRESSION: Mildly comminuted but nondisplaced fracture through the distal tuft
of the great toe distal phalanx.
